# Patient Record
Sex: Male | Born: 1990 | Race: Black or African American | Hispanic: No | Marital: Single | Smoking: Current every day smoker
Health system: Southern US, Community
[De-identification: ages and names within clinical notes are randomized; demographics above are authoritative.]

## PROBLEM LIST (undated history)

## (undated) HISTORY — PX: OTHER SURGICAL HISTORY: SHX169

---

## 2011-10-24 ENCOUNTER — Emergency Department (HOSPITAL_COMMUNITY)
Admission: EM | Admit: 2011-10-24 | Discharge: 2011-10-25 | Discharge status: Against Medical Advice | Payer: Self-pay | Attending: Emergency Medicine | Admitting: Emergency Medicine

## 2011-10-24 ENCOUNTER — Emergency Department (HOSPITAL_COMMUNITY)
Admission: EM | Admit: 2011-10-24 | Discharge: 2011-10-24 | Disposition: A | Discharge status: Home / Self Care | Payer: Self-pay | Source: Home / Self Care | Attending: Emergency Medicine | Admitting: Emergency Medicine

## 2011-10-24 ENCOUNTER — Encounter (HOSPITAL_COMMUNITY): Payer: Self-pay | Admitting: Emergency Medicine

## 2011-10-24 DIAGNOSIS — M5430 Sciatica, unspecified side: Secondary | ICD-10-CM | POA: Insufficient documentation

## 2011-10-24 DIAGNOSIS — M5432 Sciatica, left side: Secondary | ICD-10-CM

## 2011-10-24 DIAGNOSIS — F17200 Nicotine dependence, unspecified, uncomplicated: Secondary | ICD-10-CM | POA: Insufficient documentation

## 2011-10-24 MED ORDER — NAPROXEN 500 MG PO TABS: 500.0000 mg | ORAL_TABLET | Freq: Two times a day (BID) | ORAL | Status: DC

## 2011-10-24 MED ORDER — PREDNISONE 20 MG PO TABS: 40.0000 mg | ORAL_TABLET | Freq: Every day | ORAL | Status: DC

## 2011-10-24 NOTE — ED Notes (Signed)
Pt states that he has a pain that goes down the back side of his legs. Pt states painful with movement. Pt ambulatory.

## 2011-10-24 NOTE — ED Provider Notes (Signed)
History     CSN: [id number]  Arrival date & time 10/24/11  8469   First MD Initiated Contact with Patient 10/24/11 [phone]      Chief Complaint  Patient presents with  . Leg Pain    (Consider location/radiation/quality/duration/timing/severity/associated sxs/prior treatment) HPI Comments: The patient complains of approximately 3 weeks of intermittent left leg pain. He states that the pain starts in his lower back and left buttock and then spreads into his lower extremity at the thigh to the knee. This is a sharp and shooting pain, worse when he bends over, intermittent and better when he lays down at night. He denies any pathologic red flags for back pain. He has not taken any medication prior to arrival.  Patient is a 21 y.o. male presenting with leg pain. The history is provided by the patient.  Leg Pain  Pertinent negatives include no numbness.    History reviewed. No pertinent past medical history.  History reviewed. No pertinent past surgical history.  History reviewed. No pertinent family history.  History  Substance Use Topics  . Smoking status: Current Everyday Smoker -- 0.5 packs/day  . Smokeless tobacco: Not on file  . Alcohol Use: No      Review of Systems  Constitutional: Negative for fever and chills.  HENT: Negative for neck pain.   Cardiovascular: Negative for leg swelling.  Gastrointestinal: Negative for nausea and vomiting.       No incontinence of bowel  Genitourinary: Negative for difficulty urinating.       No incontinence or retention  Musculoskeletal: Positive for back pain.  Skin: Negative for rash.  Neurological: Negative for weakness and numbness.    Allergies  Review of patient's allergies indicates no known allergies.  Home Medications   Current Outpatient Rx  Name Route Sig Dispense Refill  . NAPROXEN 500 MG PO TABS Oral Take 1 tablet (500 mg total) by mouth 2 (two) times daily with a meal. 30 tablet 0  . PREDNISONE 20 MG PO TABS Oral  Take 2 tablets (40 mg total) by mouth daily. 10 tablet 0    BP 142/69  Pulse 75  Temp 98.1 F (36.7 C) (Oral)  Resp 18  SpO2 98%  Physical Exam  Nursing note and vitals reviewed. Constitutional: He appears well-developed and well-nourished. No distress.  HENT:  Head: Normocephalic and atraumatic.  Eyes: Conjunctivae are normal. Right eye exhibits no discharge. Left eye exhibits no discharge. No scleral icterus.  Neck: Normal range of motion. Neck supple.  Cardiovascular: Normal rate and regular rhythm.   Pulmonary/Chest: Effort normal and breath sounds normal.  Abdominal: Soft. There is no tenderness.       No abdominal masses palpated, no tenderness  Musculoskeletal: Normal range of motion. He exhibits tenderness ( Tender to palpation in the left lower back and left buttock. Pain reproducible with straight leg raise to 45. Relieved with flexion of the knee and hip). He exhibits no edema.  Neurological:       Normal strength and sensation of the bilateral lower extremities, normal reflexes at the patellar tendons, mildly antalgic gait secondary to back pain  Skin: Skin is warm and dry. No rash noted. He is not diaphoretic. No erythema.    ED Course  Procedures (including critical care time)  Labs Reviewed - No data to display No results found.   1. Sciatica of left side       MDM  Overall well-appearing, intermittent but worsening sciatica, patient declines Decadron but accepts  oral prednisone and Naprosyn as outpatient.  Discharge Prescriptions include:  Naprosyn Prednisone  Followup instructions given, indications for return outlined, patient expresses understanding.        Vida Roller, MD 10/24/11 [phone]

## 2011-10-24 NOTE — ED Notes (Signed)
Patient complaining of left leg pain that started three weeks ago.  States that pain starts in left hip area and spreads down leg.  Denies injury.

## 2011-10-24 NOTE — Discharge Instructions (Signed)
Your back pain should be treated with medicines such as ibuprofen or aleve and this back pain should get better over the next 2 weeks.  However if you develop severe or worsening pain, low back pain with fever, numbness, weakness or inability to walk or urinate, you should return to the ER immediately.  Please follow up with your doctor this week for a recheck if still having symptoms.  

## 2011-10-24 NOTE — ED Notes (Signed)
PT is reporting he has left leg pain from lower back to buttocks to calf that is causing him difficulty walking; sciatic type symptoms and cramping sensation especially right when he gets up from rest.

## 2011-10-25 NOTE — ED Notes (Signed)
Pt ambulated to restroom with no distress.  

## 2011-10-25 NOTE — ED Notes (Signed)
Pt reports he wants to sign out; he "has px meds at home but wanted a shot since it was quicker". PA aware.

## 2011-12-11 ENCOUNTER — Encounter (HOSPITAL_COMMUNITY): Payer: Self-pay | Admitting: Emergency Medicine

## 2011-12-11 ENCOUNTER — Emergency Department (HOSPITAL_COMMUNITY)
Admission: EM | Admit: 2011-12-11 | Discharge: 2011-12-11 | Disposition: A | Discharge status: Home / Self Care | Payer: Self-pay | Attending: Emergency Medicine | Admitting: Emergency Medicine

## 2011-12-11 DIAGNOSIS — M545 Low back pain, unspecified: Secondary | ICD-10-CM | POA: Insufficient documentation

## 2011-12-11 DIAGNOSIS — X58XXXA Exposure to other specified factors, initial encounter: Secondary | ICD-10-CM | POA: Insufficient documentation

## 2011-12-11 DIAGNOSIS — T148XXA Other injury of unspecified body region, initial encounter: Secondary | ICD-10-CM | POA: Insufficient documentation

## 2011-12-11 DIAGNOSIS — M722 Plantar fascial fibromatosis: Secondary | ICD-10-CM | POA: Insufficient documentation

## 2011-12-11 DIAGNOSIS — F17200 Nicotine dependence, unspecified, uncomplicated: Secondary | ICD-10-CM | POA: Insufficient documentation

## 2011-12-11 DIAGNOSIS — M25569 Pain in unspecified knee: Secondary | ICD-10-CM | POA: Insufficient documentation

## 2011-12-11 DIAGNOSIS — M549 Dorsalgia, unspecified: Secondary | ICD-10-CM

## 2011-12-11 DIAGNOSIS — M25561 Pain in right knee: Secondary | ICD-10-CM

## 2011-12-11 MED ORDER — NAPROXEN 500 MG PO TABS: 500.0000 mg | ORAL_TABLET | Freq: Two times a day (BID) | ORAL | Status: DC

## 2011-12-11 NOTE — ED Provider Notes (Signed)
History     CSN: [id number]  Arrival date & time 12/11/11  2030   First MD Initiated Contact with Patient 12/11/11 2129      Chief Complaint  Patient presents with  . Knee Pain  . Back Pain   HPI  History provided by the patient. Patient is a 21 year old male with no significant PMH who presents with multiple complaints. Patient complains of right knee pain, left lower back pain that radiates to the buttocks and left heel pains. Symptoms have been waxing and waning for the past month or longer. Patient denies any specific injuries or activities that cause pains. Pains are worse with movements and walking. Patient reports left heel pain is especially intense when he first wakes up in the morning and does improve with activity through the day. Right knee pains also worse with walking and movements. Patient has used occasional over-the-counter medications but nothing regularly. He has not used any other treatments. Denies any other symptoms. Denies any numbness or weakness. Denies any fecal or urinary incontinence, urinary retention or perineal numbness.    History reviewed. No pertinent past medical history.  History reviewed. No pertinent past surgical history.  History reviewed. No pertinent family history.  History  Substance Use Topics  . Smoking status: Current Everyday Smoker -- 0.5 packs/day  . Smokeless tobacco: Not on file  . Alcohol Use: No      Review of Systems  Gastrointestinal: Negative for nausea and vomiting.  Genitourinary: Negative for dysuria, hematuria and flank pain.  Musculoskeletal: Positive for back pain and arthralgias.  Neurological: Negative for weakness and numbness.    Allergies  Review of patient's allergies indicates no known allergies.  Home Medications  No current outpatient prescriptions on file.  BP 132/55  Pulse 64  Temp 99.1 F (37.3 C) (Oral)  Resp 18  SpO2 100%  Physical Exam  Nursing note and vitals reviewed. Constitutional:  He is oriented to person, place, and time. He appears well-developed and well-nourished.  HENT:  Head: Normocephalic.  Cardiovascular: Normal rate and regular rhythm.   Pulmonary/Chest: Effort normal and breath sounds normal.  Abdominal: Soft.       No CVA tenderness  Musculoskeletal:       Lumbar back: He exhibits no tenderness and no bony tenderness.       Mild tenderness of left heel and plantar surface. No gross deformities. Normal dorsal pedal pulses, sensation in toes and cap refill.  Full range of motion of right knee. Negative anterior posterior drawer test. No increased laxity with valgus rare stress. Very mild signs of joint effusion on ballottement. Normal distal sensations, pulses in foot.     Neurological: He is alert and oriented to person, place, and time. He has normal strength. No sensory deficit.  Skin: Skin is warm. No rash noted. No erythema.  Psychiatric: He has a normal mood and affect. His behavior is normal.    ED Course  Procedures     1. Back pain   2. Muscle strain   3. Plantar fasciitis of left foot   4. Knee pain, right       MDM  9:50PM patient seen and evaluated. Patient with multiple complaints. No concerning or emergent findings on exam. Patient with no lumbar spinous tenderness. No concerning symptoms. No indications for imaging at this time.        Angus Seller, [place] 12/12/11 [phone]

## 2011-12-11 NOTE — ED Notes (Addendum)
Patient complaining of lower back pain that radiates down his left leg, especially upon movement, that started a month ago.  Patient also reporting of right knee pain and swelling; reports being diagnosed with fluid around knee.  Patient ambulatory to in triage; able to move all extremities without difficulty.  Denies any changes to urine; denies any injuries.

## 2011-12-11 NOTE — ED Notes (Signed)
Patient is alert and oriented x4.  Patient is able to walk.  Discharge instructions were explained no questions asked.

## 2011-12-14 NOTE — ED Provider Notes (Signed)
Medical screening examination/treatment/procedure(s) were performed by non-physician practitioner and as supervising physician I was immediately available for consultation/collaboration.   Lonnetta Kniskern, MD 12/14/11 2232 

## 2011-12-22 ENCOUNTER — Emergency Department (HOSPITAL_COMMUNITY)
Admission: EM | Admit: 2011-12-22 | Discharge: 2011-12-22 | Disposition: A | Discharge status: Home / Self Care | Payer: Self-pay | Attending: Emergency Medicine | Admitting: Emergency Medicine

## 2011-12-22 ENCOUNTER — Encounter (HOSPITAL_COMMUNITY): Payer: Self-pay | Admitting: Emergency Medicine

## 2011-12-22 DIAGNOSIS — M5430 Sciatica, unspecified side: Secondary | ICD-10-CM | POA: Insufficient documentation

## 2011-12-22 DIAGNOSIS — F17200 Nicotine dependence, unspecified, uncomplicated: Secondary | ICD-10-CM | POA: Insufficient documentation

## 2011-12-22 LAB — URINALYSIS, ROUTINE W REFLEX MICROSCOPIC
Ketones, ur: NEGATIVE mg/dL
Leukocytes, UA: NEGATIVE
Nitrite: NEGATIVE
Protein, ur: NEGATIVE mg/dL
Urobilinogen, UA: 1 mg/dL (ref 0.0–1.0)
pH: 6 (ref 5.0–8.0)

## 2011-12-22 MED ORDER — PREDNISONE 20 MG PO TABS
60.0000 mg | ORAL_TABLET | Freq: Once | ORAL | Status: AC
  Administered 2011-12-22: 60 mg via ORAL
  Filled 2011-12-22: qty 3

## 2011-12-22 MED ORDER — IBUPROFEN 800 MG PO TABS: 800.0000 mg | ORAL_TABLET | Freq: Three times a day (TID) | ORAL | Status: AC

## 2011-12-22 MED ORDER — PREDNISONE 10 MG PO TABS: 20.0000 mg | ORAL_TABLET | Freq: Every day | ORAL | Status: DC

## 2011-12-22 MED ORDER — IBUPROFEN 800 MG PO TABS
800.0000 mg | ORAL_TABLET | Freq: Once | ORAL | Status: AC
  Administered 2011-12-22: 800 mg via ORAL
  Filled 2011-12-22: qty 1

## 2011-12-22 NOTE — ED Notes (Signed)
PT. REPORTS HEADACHE AND LOW ABDOMINAL PAIN ONSET THIS EVENING , LOW BACK PAIN RADIATING TO LEFT LOWER LEG FOR 1 MONTH AND LEFT WRIST PAIN FOR SEVERAL DAYS .

## 2011-12-22 NOTE — ED Provider Notes (Signed)
History     CSN: [id number]  Arrival date & time 12/22/11  0039   First MD Initiated Contact with Patient 12/22/11 [phone]      Chief Complaint  Patient presents with  . Headache    (Consider location/radiation/quality/duration/timing/severity/associated sxs/prior treatment) HPI Comments: Patient presents with left low back pain radiating down his left leg for the past one month. His been seen multiple times for this in the past. He states he did not fill his prescriptions. The pain radiates down his left leg. There is no weakness, numbness, tingling, bowel or bladder incontinence, fever or vomiting. He denies any injury to his back. Also reports gradual onset headache tonight with intermittent left wrist pain with movement. No trauma to the wrist.  The history is provided by the patient.    History reviewed. No pertinent past medical history.  History reviewed. No pertinent past surgical history.  No family history on file.  History  Substance Use Topics  . Smoking status: Current Everyday Smoker -- 0.5 packs/day  . Smokeless tobacco: Not on file  . Alcohol Use: No      Review of Systems  Constitutional: Negative for fever, activity change and appetite change.  HENT: Negative for rhinorrhea.   Respiratory: Negative for chest tightness.   Cardiovascular: Negative for chest pain.  Gastrointestinal: Positive for abdominal pain. Negative for nausea and vomiting.  Genitourinary: Negative for dysuria.  Musculoskeletal: Positive for back pain.  Skin: Negative for wound.  Neurological: Positive for headaches. Negative for weakness and numbness.    Allergies  Review of patient's allergies indicates no known allergies.  Home Medications   Current Outpatient Rx  Name Route Sig Dispense Refill  . IBUPROFEN 800 MG PO TABS Oral Take 1 tablet (800 mg total) by mouth 3 (three) times daily. 21 tablet 0  . NAPROXEN 500 MG PO TABS Oral Take 1 tablet (500 mg total) by mouth 2 (two)  times daily. 30 tablet 0  . PREDNISONE 10 MG PO TABS Oral Take 2 tablets (20 mg total) by mouth daily. 15 tablet 0    BP 141/83  Temp 98.2 F (36.8 C) (Oral)  Resp 18  SpO2 98%  Physical Exam  Constitutional: He is oriented to person, place, and time. He appears well-developed and well-nourished. No distress.       Talking on phone  HENT:  Head: Normocephalic and atraumatic.  Mouth/Throat: Oropharynx is clear and moist. No oropharyngeal exudate.  Eyes: Conjunctivae and EOM are normal. Pupils are equal, round, and reactive to light.  Neck: Normal range of motion. Neck supple.  Cardiovascular: Normal rate, regular rhythm and normal heart sounds.   Pulmonary/Chest: Effort normal and breath sounds normal. No respiratory distress.  Abdominal: Soft. Bowel sounds are normal. He exhibits no mass. There is no tenderness. There is no rebound and no guarding.  Musculoskeletal: Normal range of motion. He exhibits tenderness.       L paraspinal lumbar tenderness and SI joint tenderness  L wrist without deformity. NO snuffbox pain. +2 radial pulse, cardinal hand movements intact.  Neurological: He is alert and oriented to person, place, and time. He has normal reflexes. No cranial nerve deficit.       5/5 strength in the lower extremities. +2 patellar reflexes. Ankle plantar and dorsi flexion intact bilaterally. +2 DP and PT pulses. Able to extend great toes bilaterally.  Skin: Skin is warm.    ED Course  Procedures (including critical care time)   Labs Reviewed  URINALYSIS,  ROUTINE W REFLEX MICROSCOPIC   No results found.   1. Sciatica       MDM  Multiple complaints including headache, abdominal pain, wrist pain, back pain.  Primary complaint seems to be L low back pain radiating down L leg. Abdomen is soft and nontender.   Appears well, no distress. No neuro deficits or red flags. Treat sciatica with NSAIDS and steroids. PCP referrals given.      Glynn Octave,  MD 12/22/11 1535

## 2011-12-22 NOTE — ED Notes (Signed)
Pt. States abdominal pain starting this morning after eating a bologna sandwich, constant nausea, denies tenderness or vomiting. Reports difficulty with starting urinary stream. Also reports headache starting today. Also reports left wrist pain for x3 days. Denies injury. Pulses strong, cap refill instant. Pt. Alert and oriented x4.

## 2011-12-22 NOTE — ED Notes (Signed)
Prescriptions given with discharge instructions.  

## 2011-12-24 ENCOUNTER — Encounter (HOSPITAL_COMMUNITY): Payer: Self-pay | Admitting: Emergency Medicine

## 2011-12-24 ENCOUNTER — Emergency Department (HOSPITAL_COMMUNITY)
Admission: EM | Admit: 2011-12-24 | Discharge: 2011-12-24 | Disposition: A | Discharge status: Home / Self Care | Payer: Self-pay | Attending: Emergency Medicine | Admitting: Emergency Medicine

## 2011-12-24 DIAGNOSIS — M545 Low back pain, unspecified: Secondary | ICD-10-CM | POA: Insufficient documentation

## 2011-12-24 DIAGNOSIS — M549 Dorsalgia, unspecified: Secondary | ICD-10-CM

## 2011-12-24 DIAGNOSIS — F17200 Nicotine dependence, unspecified, uncomplicated: Secondary | ICD-10-CM | POA: Insufficient documentation

## 2011-12-24 MED ORDER — KETOROLAC TROMETHAMINE 30 MG/ML IJ SOLN
INTRAMUSCULAR | Status: AC
  Administered 2011-12-24: 60 mg via INTRAMUSCULAR
  Filled 2011-12-24: qty 2

## 2011-12-24 MED ORDER — KETOROLAC TROMETHAMINE 15 MG/ML IJ SOLN
60.0000 mg | Freq: Once | INTRAMUSCULAR | Status: DC
  Filled 2011-12-24: qty 4

## 2011-12-24 MED ORDER — TRAMADOL HCL 50 MG PO TABS: 50.0000 mg | ORAL_TABLET | Freq: Four times a day (QID) | ORAL | Status: DC | PRN

## 2011-12-24 NOTE — ED Notes (Signed)
C/o L lower back pain that radiates to L hip and L leg since Friday morning.  Denies known injury.

## 2011-12-24 NOTE — ED Provider Notes (Signed)
History     CSN: [id number]  Arrival date & time 12/24/11  0200   First MD Initiated Contact with Patient 12/24/11 0301      Chief Complaint  Patient presents with  . Back Pain    (Consider location/radiation/quality/duration/timing/severity/associated sxs/prior treatment) Patient is a 21 y.o. male presenting with back pain. The history is provided by the patient.  Back Pain  This is a chronic problem. The current episode started more than 1 week ago. The problem occurs constantly. The problem has not changed since onset.The pain is associated with no known injury. The pain is present in the lumbar spine. The quality of the pain is described as aching. The pain radiates to the left thigh, left knee and left foot. The pain is at a severity of 3/10. The pain is mild. He has tried NSAIDs for the symptoms. The treatment provided no relief.    History reviewed. No pertinent past medical history.  History reviewed. No pertinent past surgical history.  No family history on file.  History  Substance Use Topics  . Smoking status: Current Everyday Smoker -- 0.5 packs/day  . Smokeless tobacco: Not on file  . Alcohol Use: No      Review of Systems  Musculoskeletal: Positive for back pain.  All other systems reviewed and are negative.    Allergies  Review of patient's allergies indicates no known allergies.  Home Medications   Current Outpatient Rx  Name Route Sig Dispense Refill  . IBUPROFEN 800 MG PO TABS Oral Take 1 tablet (800 mg total) by mouth 3 (three) times daily. 21 tablet 0  . NAPROXEN 500 MG PO TABS Oral Take 1 tablet (500 mg total) by mouth 2 (two) times daily. 30 tablet 0  . PREDNISONE 10 MG PO TABS Oral Take 2 tablets (20 mg total) by mouth daily. 15 tablet 0    BP 125/58  Temp 98.2 F (36.8 C) (Oral)  Resp 18  SpO2 98%  Physical Exam  Constitutional: He is oriented to person, place, and time. He appears well-developed and well-nourished.  HENT:  Head:  Normocephalic and atraumatic.  Eyes: Conjunctivae are normal. Pupils are equal, round, and reactive to light.  Neck: Normal range of motion. Neck supple.  Cardiovascular: Normal rate, regular rhythm, normal heart sounds and intact distal pulses.   Pulmonary/Chest: Effort normal and breath sounds normal.  Abdominal: Soft. Bowel sounds are normal.  Neurological: He is alert and oriented to person, place, and time.  Skin: Skin is warm and dry.  Psychiatric: He has a normal mood and affect. His behavior is normal. Judgment and thought content normal.    ED Course  Procedures (including critical care time)  Labs Reviewed - No data to display No results found.   No diagnosis found.    MDM  No sxs of epidural compression.  nv intact.  Cap refill normal.  Normal web space sensation.  Will analgesia, outpt fu with pmd,  Ret new/worsening sxs        Trevino Wyatt Lytle Michaels, MD 12/24/11 [phone]

## 2012-01-03 ENCOUNTER — Encounter (HOSPITAL_COMMUNITY): Payer: Self-pay | Admitting: Emergency Medicine

## 2012-01-03 ENCOUNTER — Emergency Department (HOSPITAL_COMMUNITY)
Admission: EM | Admit: 2012-01-03 | Discharge: 2012-01-03 | Disposition: A | Discharge status: Home / Self Care | Payer: Self-pay | Attending: Emergency Medicine | Admitting: Emergency Medicine

## 2012-01-03 ENCOUNTER — Emergency Department (HOSPITAL_COMMUNITY): Payer: Self-pay

## 2012-01-03 DIAGNOSIS — M549 Dorsalgia, unspecified: Secondary | ICD-10-CM | POA: Insufficient documentation

## 2012-01-03 DIAGNOSIS — F17200 Nicotine dependence, unspecified, uncomplicated: Secondary | ICD-10-CM | POA: Insufficient documentation

## 2012-01-03 DIAGNOSIS — M79609 Pain in unspecified limb: Secondary | ICD-10-CM | POA: Insufficient documentation

## 2012-01-03 MED ORDER — METHOCARBAMOL 500 MG PO TABS: 500.0000 mg | ORAL_TABLET | Freq: Two times a day (BID) | ORAL | Status: AC

## 2012-01-03 MED ORDER — HYDROCODONE-ACETAMINOPHEN 5-325 MG PO TABS
1.0000 | ORAL_TABLET | Freq: Once | ORAL | Status: AC
  Administered 2012-01-03: 1 via ORAL
  Filled 2012-01-03: qty 1

## 2012-01-03 NOTE — ED Notes (Signed)
PT. REPORTS CHRONIC LOW BACK PAIN RADIATING TO LEFT LEG FOR SEVERAL MONTHS , DENIES INJURY , AMBULATORY .

## 2012-01-03 NOTE — ED Provider Notes (Signed)
History     CSN: [id number]  Arrival date & time 01/03/12  2006   First MD Initiated Contact with Patient 01/03/12 2212      Chief Complaint  Patient presents with  . Back Pain   HPI  History provided by the patient in prior medical charts. Patient is a 21 year old male with no significant PMH who returns for continued low back pains. Patient has multiple visits to the emergency room for same complaints. Patient states he has had low back pain for the past 3 months. Pain is worse with increased activity and movements. Pain is worse at the end of the day after working. Patient has been using over-the-counter pain medications and occasionally prescription narcotics for which he is obtained from emergency room visits.    History reviewed. No pertinent past medical history.  History reviewed. No pertinent past surgical history.  No family history on file.  History  Substance Use Topics  . Smoking status: Current Everyday Smoker -- 0.5 packs/day  . Smokeless tobacco: Not on file  . Alcohol Use: No      Review of Systems  Constitutional: Negative for fever, chills and appetite change.  Respiratory: Negative for cough and shortness of breath.   Cardiovascular: Negative for chest pain.  Gastrointestinal: Negative for nausea, vomiting and abdominal pain.  Genitourinary: Negative for dysuria, frequency, hematuria and flank pain.  Musculoskeletal: Positive for back pain.  Neurological: Negative for weakness, light-headedness and numbness.    Allergies  Review of patient's allergies indicates no known allergies.  Home Medications  No current outpatient prescriptions on file.  BP 125/73  Pulse 67  Temp 98.3 F (36.8 C) (Oral)  Resp 16  SpO2 98%  Physical Exam  Nursing note and vitals reviewed. Constitutional: He is oriented to person, place, and time. He appears well-developed and well-nourished.  HENT:  Head: Normocephalic.  Cardiovascular: Normal rate and regular  rhythm.   Pulmonary/Chest: Effort normal and breath sounds normal.  Abdominal: Soft. There is no tenderness. There is no rebound and no guarding.       No CVA tenderness  Musculoskeletal:       Lumbar back: He exhibits tenderness. He exhibits no bony tenderness.       Back:            Neurological: He is alert and oriented to person, place, and time. He has normal strength. No sensory deficit. Gait normal.  Skin: Skin is warm. No rash noted. No erythema.  Psychiatric: He has a normal mood and affect. His behavior is normal.    ED Course  Procedures   Dg Lumbar Spine Complete  01/03/2012  *RADIOLOGY REPORT*  Clinical Data: Low back and left hip and leg pain.  LUMBAR SPINE - COMPLETE 4+ VIEW  Comparison: None.  Findings: Vertebral body height is maintained.  Intervertebral disc space height appears normal.  Convex left scoliosis noted. Paraspinous structures are unremarkable.  IMPRESSION: Convex left scoliosis.  Otherwise negative.   Original Report Authenticated By: Bernadene Bell. D'ALESSIO, M.D.      1. Back pain       MDM  Patient seen and evaluated. Patient with multiple prior visits to emergency room for similar complaints. No new injury or trauma. No new symptoms. Patient given instructions for the need to followup with a primary care provider or orthopedic specialist.        Angus Seller, PA 01/04/12 [phone]

## 2012-01-04 NOTE — ED Provider Notes (Signed)
Medical screening examination/treatment/procedure(s) were performed by non-physician practitioner and as supervising physician I was immediately available for consultation/collaboration.   Carleene Cooper III, MD 01/04/12 1124

## 2012-01-11 ENCOUNTER — Encounter (HOSPITAL_COMMUNITY): Payer: Self-pay | Admitting: Emergency Medicine

## 2012-01-11 ENCOUNTER — Emergency Department (HOSPITAL_COMMUNITY): Payer: Self-pay

## 2012-01-11 DIAGNOSIS — M79609 Pain in unspecified limb: Secondary | ICD-10-CM | POA: Insufficient documentation
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
## 2012-01-12 ENCOUNTER — Emergency Department (HOSPITAL_COMMUNITY)
Admission: EM | Admit: 2012-01-12 | Discharge: 2012-01-12 | Discharge status: Against Medical Advice | Payer: Self-pay | Attending: Emergency Medicine | Admitting: Emergency Medicine

## 2012-01-12 MED ORDER — IBUPROFEN 800 MG PO TABS: 800.0000 mg | ORAL_TABLET | Freq: Once | ORAL | Status: DC

## 2012-02-21 ENCOUNTER — Emergency Department (HOSPITAL_COMMUNITY)
Admission: EM | Admit: 2012-02-21 | Discharge: 2012-02-21 | Disposition: A | Discharge status: Home / Self Care | Payer: Self-pay | Attending: Emergency Medicine | Admitting: Emergency Medicine

## 2012-02-21 ENCOUNTER — Encounter (HOSPITAL_COMMUNITY): Payer: Self-pay | Admitting: *Deleted

## 2012-02-21 DIAGNOSIS — F17200 Nicotine dependence, unspecified, uncomplicated: Secondary | ICD-10-CM | POA: Insufficient documentation

## 2012-02-21 DIAGNOSIS — K644 Residual hemorrhoidal skin tags: Secondary | ICD-10-CM | POA: Insufficient documentation

## 2012-02-21 MED ORDER — PRAMOXINE HCL 1 % RE FOAM: RECTAL | Status: DC | PRN

## 2012-02-21 NOTE — ED Notes (Signed)
Pt states that when he takes a shower he has a burning pain in his rectum. Pt states fiance told him it was a hemroid. Pt denies bleeding when wiping.

## 2012-02-21 NOTE — ED Provider Notes (Signed)
History     CSN: [id number]  Arrival date & time 02/21/12  0024   First MD Initiated Contact with Patient 02/21/12 0100      Chief Complaint  Patient presents with  . Back Pain    buttock/ peri anal  area    (Consider location/radiation/quality/duration/timing/severity/associated sxs/prior treatment) HPI Comments: 21 year old male presents emergency department complaining of a burning pain in his rectum while he took a shower tonight. His fiance told him that it looked like a hemorrhoid. He had no problems prior to taking a shower this evening besides mild discomfort while defecating earlier today. No blood in his stool or on the toilet paper. Denies constipation or straining. No back pain.  Patient is a 21 y.o. male presenting with back pain. The history is provided by the patient.  Back Pain  Pertinent negatives include no chest pain and no fever.    History reviewed. No pertinent past medical history.  History reviewed. No pertinent past surgical history.  History reviewed. No pertinent family history.  History  Substance Use Topics  . Smoking status: Current Every Day Smoker -- 0.5 packs/day  . Smokeless tobacco: Not on file  . Alcohol Use: No      Review of Systems  Constitutional: Negative for fever and chills.  Respiratory: Negative for shortness of breath.   Cardiovascular: Negative for chest pain.  Gastrointestinal: Positive for rectal pain. Negative for nausea, constipation, blood in stool and anal bleeding.  Musculoskeletal: Negative for back pain.  Skin: Negative for rash.    Allergies  Review of patient's allergies indicates no known allergies.  Home Medications  No current outpatient prescriptions on file.  BP 128/69  Pulse 67  Temp 98.2 F (36.8 C) (Oral)  Resp 16  SpO2 100%  Physical Exam  Constitutional: He is oriented to person, place, and time. He appears well-developed and well-nourished. No distress.  HENT:  Head: Normocephalic and  atraumatic.  Mouth/Throat: Oropharynx is clear and moist.  Eyes: Conjunctivae normal are normal.  Neck: Normal range of motion.  Cardiovascular: Normal rate, regular rhythm and normal heart sounds.   Pulmonary/Chest: Effort normal and breath sounds normal.  Abdominal: Soft. Bowel sounds are normal. There is no tenderness.  Genitourinary: Rectal exam shows external hemorrhoid (no bleeding or thrombosis).  Musculoskeletal: Normal range of motion.  Neurological: He is alert and oriented to person, place, and time.  Skin: Skin is warm and dry. No rash noted.  Psychiatric: He has a normal mood and affect. His behavior is normal.    ED Course  Procedures (including critical care time)  Labs Reviewed - No data to display No results found.   1. External hemorrhoid       MDM  21 year old male with external hemorrhoid. No active bleeding or thrombosis. Very mild tender to palpation. Will discharge with ProctoCream. Advised him to watch for blood in the stool.        Trevor Mace, PA-C 02/21/12 0120

## 2012-02-21 NOTE — ED Provider Notes (Signed)
Medical screening examination/treatment/procedure(s) were performed by non-physician practitioner and as supervising physician I was immediately available for consultation/collaboration.    Vida Roller, MD 02/21/12 [phone]

## 2012-06-07 ENCOUNTER — Encounter (HOSPITAL_COMMUNITY): Payer: Self-pay | Admitting: Emergency Medicine

## 2012-06-07 ENCOUNTER — Emergency Department (HOSPITAL_COMMUNITY): Payer: Self-pay

## 2012-06-07 ENCOUNTER — Emergency Department (HOSPITAL_COMMUNITY)
Admission: EM | Admit: 2012-06-07 | Discharge: 2012-06-07 | Disposition: A | Discharge status: Home / Self Care | Payer: Self-pay | Attending: Emergency Medicine | Admitting: Emergency Medicine

## 2012-06-07 DIAGNOSIS — F17200 Nicotine dependence, unspecified, uncomplicated: Secondary | ICD-10-CM | POA: Insufficient documentation

## 2012-06-07 DIAGNOSIS — R112 Nausea with vomiting, unspecified: Secondary | ICD-10-CM | POA: Insufficient documentation

## 2012-06-07 MED ORDER — ONDANSETRON 4 MG PO TBDP
ORAL_TABLET | ORAL | Status: AC
  Administered 2012-06-07: 4 mg via ORAL
  Filled 2012-06-07: qty 1

## 2012-06-07 MED ORDER — ACETAMINOPHEN 325 MG PO TABS
650.0000 mg | ORAL_TABLET | Freq: Once | ORAL | Status: AC
  Administered 2012-06-07: 650 mg via ORAL
  Filled 2012-06-07: qty 2

## 2012-06-07 MED ORDER — ONDANSETRON HCL 4 MG PO TABS: 4.0000 mg | ORAL_TABLET | Freq: Three times a day (TID) | ORAL | Status: DC | PRN

## 2012-06-07 MED ORDER — ONDANSETRON HCL 4 MG/2ML IJ SOLN: 4.0000 mg | Freq: Once | INTRAMUSCULAR | Status: DC

## 2012-06-07 MED ORDER — ONDANSETRON 4 MG PO TBDP
4.0000 mg | ORAL_TABLET | Freq: Once | ORAL | Status: AC
  Administered 2012-06-07: 4 mg via ORAL

## 2012-06-07 NOTE — ED Provider Notes (Signed)
Medical screening examination/treatment/procedure(s) were performed by non-physician practitioner and as supervising physician I was immediately available for consultation/collaboration.   Chundra Sauerwein, MD 06/07/12 1552 

## 2012-06-07 NOTE — ED Provider Notes (Signed)
History     CSN: [id number]  Arrival date & time 06/07/12  0913   First MD Initiated Contact with Patient 06/07/12 [phone]      Chief Complaint  Patient presents with  . Abdominal Pain    (Consider location/radiation/quality/duration/timing/severity/associated sxs/prior treatment) HPI Comments: This is a 22 year old male, who presents emergency department with chief complaint of nausea and vomiting. Patient also endorses associated left upper abdominal pain. He states that he is scheduled to be in court at 10:30 today in [place]. He states his pain is mild. He additionally complains of some chest pain, but is worsened with coughing. He states that he has had a productive cough for the past week or so. He states that his child has been sick with the same. He has not tried anything to alleviate her symptoms. Nothing makes his symptoms better or worse.  The history is provided by the patient. No language interpreter was used.    History reviewed. No pertinent past medical history.  History reviewed. No pertinent past surgical history.  No family history on file.  History  Substance Use Topics  . Smoking status: Current Every Day Smoker -- 0.5 packs/day    Types: Cigarettes  . Smokeless tobacco: Not on file  . Alcohol Use: No      Review of Systems  All other systems reviewed and are negative.    Allergies  Morphine and related  Home Medications   Current Outpatient Rx  Name  Route  Sig  Dispense  Refill  . ACETAMINOPHEN 500 MG PO TABS   Oral   Take 1,000 mg by mouth every 6 (six) hours as needed. For pain           BP 128/70  Pulse 63  Temp 97.8 F (36.6 C) (Oral)  Resp 20  Ht 6' (1.829 m)  Wt 180 lb (81.647 kg)  BMI 24.41 kg/m2  SpO2 100%  Physical Exam  Nursing note and vitals reviewed. Constitutional: He is oriented to person, place, and time. He appears well-developed and well-nourished.  HENT:  Head: Normocephalic and atraumatic.  Right Ear:  External ear normal.  Left Ear: External ear normal.  Nose: Nose normal.  Mouth/Throat: Oropharynx is clear and moist. No oropharyngeal exudate.  Eyes: Conjunctivae normal and EOM are normal. Pupils are equal, round, and reactive to light. Right eye exhibits no discharge. Left eye exhibits no discharge. No scleral icterus.  Neck: Normal range of motion. Neck supple. No JVD present.  Cardiovascular: Normal rate, regular rhythm, normal heart sounds and intact distal pulses.  Exam reveals no gallop and no friction rub.   No murmur heard. Pulmonary/Chest: Effort normal and breath sounds normal. No respiratory distress. He has no wheezes. He has no rales. He exhibits no tenderness.  Abdominal: Soft. Bowel sounds are normal. He exhibits no distension and no mass. There is no tenderness. There is no rebound and no guarding.       Mildly tender abdomen in the upper left quadrant, no right upper quadrant tenderness or Murphy sign, no right lower quadrant tenderness or pain at McBurney's point, no rebound tenderness, no signs of peritonitis.  Musculoskeletal: Normal range of motion. He exhibits no edema and no tenderness.  Neurological: He is alert and oriented to person, place, and time. He has normal reflexes.  Skin: Skin is warm and dry.  Psychiatric: He has a normal mood and affect. His behavior is normal. Judgment and thought content normal.    ED Course  Procedures (  including critical care time)  Labs Reviewed - No data to display No results found. ED ECG REPORT  I personally interpreted this EKG   Date: 06/07/2012   Rate:56  Rhythm: normal sinus rhythm  QRS Axis: normal  Intervals: normal  ST/T Wave abnormalities: normal  Conduction Disutrbances:none  Narrative Interpretation:   Old EKG Reviewed: none available    1. Nausea and vomiting       MDM  Is a 22 year old male with nausea and vomiting. Patient states that he is supposed to be in court at 10:30 today. I'm slightly  suspicious for malingering. However, I will treat the patient with Zofran, and obtain a chest x-ray to rule out pneumonia. Will PO challenge the patient and discharge if he tolerates PO.  10:45 AM Tolerating PO.  Feels better. Will discharge with Zofran.  Patient understands and agrees with the plan.        Roxy Horseman, PA-C 06/07/12 1045  Roxy Horseman, PA-C 06/07/12 1140

## 2012-06-07 NOTE — ED Notes (Addendum)
Pt request medication for HA. Pt drinking Sprite with out c/o nausea or abd pain

## 2012-06-07 NOTE — ED Notes (Signed)
Pt c/o LUA pain that started at 0630 with vomiting x1 and then pt reports right side midsternal CP started. NAD noted at this time, pt on cell phone.
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
## 2012-06-07 NOTE — ED Notes (Signed)
NAD noted at time of d/c home 

## 2012-07-29 ENCOUNTER — Encounter (HOSPITAL_COMMUNITY): Payer: Self-pay

## 2012-07-29 ENCOUNTER — Emergency Department (HOSPITAL_COMMUNITY)
Admission: EM | Admit: 2012-07-29 | Discharge: 2012-07-29 | Disposition: A | Discharge status: Home / Self Care | Payer: Self-pay | Attending: Emergency Medicine | Admitting: Emergency Medicine

## 2012-07-29 DIAGNOSIS — F17200 Nicotine dependence, unspecified, uncomplicated: Secondary | ICD-10-CM | POA: Insufficient documentation

## 2012-07-29 DIAGNOSIS — R110 Nausea: Secondary | ICD-10-CM | POA: Insufficient documentation

## 2012-07-29 DIAGNOSIS — R197 Diarrhea, unspecified: Secondary | ICD-10-CM | POA: Insufficient documentation

## 2012-07-29 DIAGNOSIS — R51 Headache: Secondary | ICD-10-CM | POA: Insufficient documentation

## 2012-07-29 DIAGNOSIS — R109 Unspecified abdominal pain: Secondary | ICD-10-CM | POA: Insufficient documentation

## 2012-07-29 LAB — POCT I-STAT, CHEM 8
Glucose, Bld: 85 mg/dL (ref 70–99)
HCT: 46 % (ref 39.0–52.0)
Hemoglobin: 15.6 g/dL (ref 13.0–17.0)
Potassium: 3.8 mEq/L (ref 3.5–5.1)
Sodium: 140 mEq/L (ref 135–145)
TCO2: 28 mmol/L (ref 0–100)

## 2012-07-29 LAB — CBC WITH DIFFERENTIAL/PLATELET
Basophils Relative: 0 % (ref 0–1)
Eosinophils Absolute: 0.1 10*3/uL (ref 0.0–0.7)
Eosinophils Relative: 1 % (ref 0–5)
MCH: 29.9 pg (ref 26.0–34.0)
MCHC: 34.5 g/dL (ref 30.0–36.0)
MCV: 86.7 fL (ref 78.0–100.0)
Neutrophils Relative %: 81 % — ABNORMAL HIGH (ref 43–77)
Platelets: 150 10*3/uL (ref 150–400)

## 2012-07-29 LAB — URINALYSIS, ROUTINE W REFLEX MICROSCOPIC
Bilirubin Urine: NEGATIVE
Ketones, ur: NEGATIVE mg/dL
Nitrite: NEGATIVE
Protein, ur: NEGATIVE mg/dL
Specific Gravity, Urine: 1.028 (ref 1.005–1.030)
Urobilinogen, UA: 1 mg/dL (ref 0.0–1.0)

## 2012-07-29 MED ORDER — DICYCLOMINE HCL 10 MG/ML IM SOLN
20.0000 mg | Freq: Once | INTRAMUSCULAR | Status: AC
  Administered 2012-07-29: 20 mg via INTRAMUSCULAR
  Filled 2012-07-29: qty 2

## 2012-07-29 NOTE — ED Notes (Signed)
Pt c/o HA and abd pain X 3 days along with n/v/d. Pt in nad, resp e/u, skin warm and dry. Pt sts he hasn't had much of an appetite the past couple days.

## 2012-07-29 NOTE — ED Notes (Signed)
Pt presents with c/o headache and abdominal pain.  Pt states he began having abdominal pain around the umbilicus on Friday.  Pt also c/o slight headache.  Pt has not taken any meds for relief.  Pt is alert and oriented and in NAD.

## 2012-07-29 NOTE — ED Provider Notes (Signed)
History     CSN: [id number]  Arrival date & time 07/29/12  1223   First MD Initiated Contact with Patient 07/29/12 1414      Chief Complaint  Patient presents with  . Abdominal Pain  . Headache    (Consider location/radiation/quality/duration/timing/severity/associated sxs/prior treatment) HPI Complains of lower abdominal pain onset 2 days ago intermittent sharp last approximately an hour at a time presently discomfort is mild he reports feeling hungry he's had 10 episodes of diarrhea today. No treatment prior to coming here also admits to mild headache denies fever denies cough no other associated symptoms. Nothing makes symptoms better or worse. History reviewed. No pertinent past medical history. Past medical history is negative History reviewed. No pertinent past surgical history. Surgical history negative No family history on file.  History  Substance Use Topics  . Smoking status: Current Every Day Smoker -- 0.50 packs/day    Types: Cigarettes  . Smokeless tobacco: Not on file  . Alcohol Use: No   no alcohol no drugs    Review of Systems  Constitutional: Negative.   Respiratory: Negative.   Cardiovascular: Negative.   Gastrointestinal: Positive for nausea, abdominal pain and diarrhea. Negative for blood in stool.  Musculoskeletal: Negative.   Skin: Negative.   Neurological: Positive for headaches.  Psychiatric/Behavioral: Negative.   All other systems reviewed and are negative.    Allergies  Morphine and related  Home Medications  No current outpatient prescriptions on file.  BP 123/63  Pulse 79  Temp(Src) 98.3 F (36.8 C) (Oral)  Resp 15  SpO2 96%  Physical Exam  Nursing note and vitals reviewed. Constitutional: He appears well-developed and well-nourished.  HENT:  Head: Normocephalic and atraumatic.  Eyes: Conjunctivae are normal. Pupils are equal, round, and reactive to light.  Neck: Neck supple. No tracheal deviation present. No thyromegaly  present.  Cardiovascular: Normal rate and regular rhythm.   No murmur heard. Pulmonary/Chest: Effort normal and breath sounds normal.  Abdominal: Soft. Bowel sounds are normal. He exhibits no distension and no mass. There is tenderness. There is no rebound and no guarding.  Minimally tender at right lower quadrant  Genitourinary: Penis normal.  Normal male genitalia, UNCIRCUMCIZED  Musculoskeletal: Normal range of motion. He exhibits no edema and no tenderness.  Neurological: He is alert. Coordination normal.  Skin: Skin is warm and dry. No rash noted.  Psychiatric: He has a normal mood and affect.    ED Course  Procedures (including critical care time) Declined pain medicine Labs Reviewed  URINALYSIS, ROUTINE W REFLEX MICROSCOPIC  CBC WITH DIFFERENTIAL   Results for orders placed during the hospital encounter of 07/29/12  URINALYSIS, ROUTINE W REFLEX MICROSCOPIC      Result Value Range   Color, Urine YELLOW  YELLOW   APPearance CLEAR  CLEAR   Specific Gravity, Urine 1.028  1.005 - 1.030   pH 6.0  5.0 - 8.0   Glucose, UA NEGATIVE  NEGATIVE mg/dL   Hgb urine dipstick NEGATIVE  NEGATIVE   Bilirubin Urine NEGATIVE  NEGATIVE   Ketones, ur NEGATIVE  NEGATIVE mg/dL   Protein, ur NEGATIVE  NEGATIVE mg/dL   Urobilinogen, UA 1.0  0.0 - 1.0 mg/dL   Nitrite NEGATIVE  NEGATIVE   Leukocytes, UA NEGATIVE  NEGATIVE  CBC WITH DIFFERENTIAL      Result Value Range   WBC 5.3  4.0 - 10.5 K/uL   RBC 5.02  4.22 - 5.81 MIL/uL   Hemoglobin 15.0  13.0 - 17.0 g/dL  HCT 43.5  39.0 - 52.0 %   MCV 86.7  78.0 - 100.0 fL   MCH 29.9  26.0 - 34.0 pg   MCHC 34.5  30.0 - 36.0 g/dL   RDW 78.2  95.6 - 21.3 %   Platelets 150  150 - 400 K/uL   Neutrophils Relative 81 (*) 43 - 77 %   Neutro Abs 4.3  1.7 - 7.7 K/uL   Lymphocytes Relative 10 (*) 12 - 46 %   Lymphs Abs 0.5 (*) 0.7 - 4.0 K/uL   Monocytes Relative 8  3 - 12 %   Monocytes Absolute 0.4  0.1 - 1.0 K/uL   Eosinophils Relative 1  0 - 5 %    Eosinophils Absolute 0.1  0.0 - 0.7 K/uL   Basophils Relative 0  0 - 1 %   Basophils Absolute 0.0  0.0 - 0.1 K/uL  POCT I-STAT, CHEM 8      Result Value Range   Sodium 140  135 - 145 mEq/L   Potassium 3.8  3.5 - 5.1 mEq/L   Chloride 106  96 - 112 mEq/L   BUN 20  6 - 23 mg/dL   Creatinine, Ser 0.86  0.50 - 1.35 mg/dL   Glucose, Bld 85  70 - 99 mg/dL   Calcium, Ion 5.78  4.69 - 1.23 mmol/L   TCO2 28  0 - 100 mmol/L   Hemoglobin 15.6  13.0 - 17.0 g/dL   HCT 62.9  52.8 - 41.3 %   No results found.  No results found.  Date: 03 PM developed abdominal pain after drinking water. bENTYL ordered No diagnosis found. 5:30 PM feels much improved after treatment with Bentyl. Feels hungry  MDM  Doubt appendicitis with intermittent nature of pain, copious diarrhea and patient is presently hungry No fever no leukocytosis Plan avoid milk products. Imodium. Return if worse 24 hours Dx #1 nonspecific abdominal pain #2 diarrhea       Doug Sou, MD 07/29/12 [phone]

## 2012-08-09 ENCOUNTER — Emergency Department (HOSPITAL_COMMUNITY): Payer: Self-pay

## 2012-08-09 ENCOUNTER — Emergency Department (HOSPITAL_COMMUNITY)
Admission: EM | Admit: 2012-08-09 | Discharge: 2012-08-09 | Disposition: A | Discharge status: Home / Self Care | Payer: Self-pay | Attending: Emergency Medicine | Admitting: Emergency Medicine

## 2012-08-09 ENCOUNTER — Encounter (HOSPITAL_COMMUNITY): Payer: Self-pay | Admitting: Emergency Medicine

## 2012-08-09 DIAGNOSIS — F17200 Nicotine dependence, unspecified, uncomplicated: Secondary | ICD-10-CM | POA: Insufficient documentation

## 2012-08-09 DIAGNOSIS — IMO0002 Reserved for concepts with insufficient information to code with codable children: Secondary | ICD-10-CM | POA: Insufficient documentation

## 2012-08-09 DIAGNOSIS — M549 Dorsalgia, unspecified: Secondary | ICD-10-CM

## 2012-08-09 DIAGNOSIS — S0003XA Contusion of scalp, initial encounter: Secondary | ICD-10-CM | POA: Insufficient documentation

## 2012-08-09 DIAGNOSIS — S0083XA Contusion of other part of head, initial encounter: Secondary | ICD-10-CM

## 2012-08-09 MED ORDER — ACETAMINOPHEN 325 MG PO TABS
650.0000 mg | ORAL_TABLET | Freq: Once | ORAL | Status: AC
  Administered 2012-08-09: 650 mg via ORAL
  Filled 2012-08-09: qty 2
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
## 2012-08-09 NOTE — ED Notes (Signed)
Patient to radiology at this time.
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
## 2012-10-03 ENCOUNTER — Encounter (HOSPITAL_COMMUNITY): Payer: Self-pay | Admitting: Physical Medicine and Rehabilitation

## 2012-10-03 ENCOUNTER — Emergency Department (HOSPITAL_COMMUNITY)
Admission: EM | Admit: 2012-10-03 | Discharge: 2012-10-03 | Disposition: A | Discharge status: Home / Self Care | Payer: Self-pay | Attending: Emergency Medicine | Admitting: Emergency Medicine

## 2012-10-03 DIAGNOSIS — Z0000 Encounter for general adult medical examination without abnormal findings: Secondary | ICD-10-CM

## 2012-10-03 DIAGNOSIS — Z711 Person with feared health complaint in whom no diagnosis is made: Secondary | ICD-10-CM | POA: Insufficient documentation

## 2012-10-03 DIAGNOSIS — F17200 Nicotine dependence, unspecified, uncomplicated: Secondary | ICD-10-CM | POA: Insufficient documentation

## 2012-10-03 NOTE — ED Notes (Signed)
Pt requesting TB skin test. No other complaints.

## 2012-10-03 NOTE — ED Provider Notes (Signed)
History     CSN: [id number]  Arrival date & time 10/03/12  1149   First MD Initiated Contact with Patient 10/03/12 1223      Chief Complaint  Patient presents with  . PPD Reading    (Consider location/radiation/quality/duration/timing/severity/associated sxs/prior treatment) HPI Shane Blanchard is a 22 y.o. male who presents to ED with complaint of needed a PPD test for his job. States no symptoms, no cough, no night sweats, no fever, no exposure. Had recent test done in jail, but states "i dont want to take my jail form to my new job." Denies any other complaints.    No past medical history on file.  No past surgical history on file.  History reviewed. No pertinent family history.  History  Substance Use Topics  . Smoking status: Current Every Day Smoker -- 0.50 packs/day    Types: Cigarettes  . Smokeless tobacco: Not on file  . Alcohol Use: No      Review of Systems  All other systems reviewed and are negative.    Allergies  Morphine and related  Home Medications  No current outpatient prescriptions on file.  BP 140/62  Pulse 68  Resp 16  SpO2 97%  Physical Exam  Nursing note and vitals reviewed. Constitutional: He appears well-developed and well-nourished. No distress.  Eyes: Conjunctivae are normal.  Neck: Neck supple.  Cardiovascular: Normal rate, regular rhythm and normal heart sounds.   Pulmonary/Chest: Effort normal and breath sounds normal. No respiratory distress. He has no wheezes. He has no rales.    ED Course  Procedures (including critical care time)  Labs Reviewed - No data to display No results found.   1. Normal physical exam       MDM  Pt needing PPD for his job. He is asymptomatic. Afebrile. No complaints. Do not do PPD here in ED. Will send to occupational health or urgent care.   Filed Vitals:   10/03/12 1157  BP: 140/62  Pulse: 68  Resp: 16  SpO2: 97%           Ipek Westra A Kyanna Mahrt, PA-C 10/03/12 1305

## 2012-10-03 NOTE — ED Notes (Signed)
Unable to obtain temp, patient drinking fluid

## 2012-10-05 NOTE — ED Provider Notes (Signed)
Medical screening examination/treatment/procedure(s) were performed by non-physician practitioner and as supervising physician I was immediately available for consultation/collaboration.   Percival Glasheen H Jaydien Panepinto, MD 10/05/12 1527 

## 2012-12-05 ENCOUNTER — Emergency Department (HOSPITAL_COMMUNITY): Payer: No Typology Code available for payment source

## 2012-12-05 ENCOUNTER — Encounter (HOSPITAL_COMMUNITY): Payer: Self-pay | Admitting: Emergency Medicine

## 2012-12-05 ENCOUNTER — Emergency Department (HOSPITAL_COMMUNITY)
Admission: EM | Admit: 2012-12-05 | Discharge: 2012-12-05 | Disposition: A | Discharge status: Home / Self Care | Payer: No Typology Code available for payment source | Attending: Emergency Medicine | Admitting: Emergency Medicine

## 2012-12-05 DIAGNOSIS — S4991XA Unspecified injury of right shoulder and upper arm, initial encounter: Secondary | ICD-10-CM

## 2012-12-05 DIAGNOSIS — S46909A Unspecified injury of unspecified muscle, fascia and tendon at shoulder and upper arm level, unspecified arm, initial encounter: Secondary | ICD-10-CM | POA: Insufficient documentation

## 2012-12-05 DIAGNOSIS — IMO0002 Reserved for concepts with insufficient information to code with codable children: Secondary | ICD-10-CM | POA: Insufficient documentation

## 2012-12-05 DIAGNOSIS — Y9389 Activity, other specified: Secondary | ICD-10-CM | POA: Insufficient documentation

## 2012-12-05 DIAGNOSIS — F17200 Nicotine dependence, unspecified, uncomplicated: Secondary | ICD-10-CM | POA: Insufficient documentation

## 2012-12-05 DIAGNOSIS — S4980XA Other specified injuries of shoulder and upper arm, unspecified arm, initial encounter: Secondary | ICD-10-CM | POA: Insufficient documentation

## 2012-12-05 DIAGNOSIS — S0993XA Unspecified injury of face, initial encounter: Secondary | ICD-10-CM | POA: Insufficient documentation

## 2012-12-05 DIAGNOSIS — M542 Cervicalgia: Secondary | ICD-10-CM

## 2012-12-05 DIAGNOSIS — S199XXA Unspecified injury of neck, initial encounter: Secondary | ICD-10-CM | POA: Insufficient documentation

## 2012-12-05 MED ORDER — NAPROXEN 500 MG PO TABS: 500.0000 mg | ORAL_TABLET | Freq: Two times a day (BID) | ORAL | Status: DC

## 2012-12-05 MED ORDER — CYCLOBENZAPRINE HCL 10 MG PO TABS: 10.0000 mg | ORAL_TABLET | Freq: Two times a day (BID) | ORAL | Status: DC | PRN

## 2012-12-05 MED ORDER — DIAZEPAM 5 MG PO TABS
5.0000 mg | ORAL_TABLET | Freq: Once | ORAL | Status: AC
  Administered 2012-12-05: 5 mg via ORAL
  Filled 2012-12-05: qty 1

## 2012-12-05 MED ORDER — KETOROLAC TROMETHAMINE 60 MG/2ML IM SOLN
60.0000 mg | Freq: Once | INTRAMUSCULAR | Status: DC
  Filled 2012-12-05: qty 2
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
## 2012-12-06 NOTE — ED Provider Notes (Signed)
Medical screening examination/treatment/procedure(s) were performed by non-physician practitioner and as supervising physician I was immediately available for consultation/collaboration.   Suzi Roots, MD 12/06/12 [phone]

## 2013-04-20 ENCOUNTER — Ambulatory Visit: Payer: Self-pay | Attending: Internal Medicine | Admitting: Internal Medicine

## 2013-04-20 DIAGNOSIS — M549 Dorsalgia, unspecified: Secondary | ICD-10-CM | POA: Insufficient documentation

## 2013-04-20 LAB — COMPLETE METABOLIC PANEL WITH GFR
BUN: 15 mg/dL (ref 6–23)
CO2: 27 mEq/L (ref 19–32)
GFR, Est African American: >89 mL/min
GFR, Est Non African American: >89 mL/min
Glucose, Bld: 91 mg/dL (ref 70–99)
Sodium: 141 mEq/L (ref 135–145)
Total Bilirubin: 0.5 mg/dL (ref 0.3–1.2)
Total Protein: 7.5 g/dL (ref 6.0–8.3)

## 2013-04-20 LAB — LIPID PANEL
Cholesterol: 159 mg/dL (ref 0–200)
HDL: 39 mg/dL — ABNORMAL LOW (ref >39)
Triglycerides: 118 mg/dL (ref <150)

## 2013-04-20 LAB — CBC WITH DIFFERENTIAL/PLATELET
Basophils Absolute: 0 10*3/uL (ref 0.0–0.1)
Lymphocytes Relative: 24 % (ref 12–46)
Lymphs Abs: 0.9 10*3/uL (ref 0.7–4.0)
Neutro Abs: 2.5 10*3/uL (ref 1.7–7.7)
Neutrophils Relative %: 63 % (ref 43–77)
Platelets: 173 10*3/uL (ref 150–400)
RBC: 5.01 MIL/uL (ref 4.22–5.81)
RDW: 13 % (ref 11.5–15.5)
WBC: 3.9 10*3/uL — ABNORMAL LOW (ref 4.0–10.5)

## 2013-04-20 LAB — TSH: TSH: 0.732 u[IU]/mL (ref 0.350–4.500)

## 2013-04-20 MED ORDER — CYCLOBENZAPRINE HCL 10 MG PO TABS: 10.0000 mg | ORAL_TABLET | Freq: Two times a day (BID) | ORAL | Status: DC | PRN

## 2013-04-20 MED ORDER — PREDNISONE 20 MG PO TABS: 40.0000 mg | ORAL_TABLET | Freq: Every day | ORAL | Status: DC
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
## 2013-04-22 ENCOUNTER — Telehealth: Payer: Self-pay | Admitting: *Deleted

## 2013-04-22 NOTE — Telephone Encounter (Signed)
Contacted patient to notify him that his labs are within normal limits, MRI results are still pending. Pt acknowledged information with no problems.

## 2013-04-24 ENCOUNTER — Emergency Department (HOSPITAL_COMMUNITY)
Admission: EM | Admit: 2013-04-24 | Discharge: 2013-04-24 | Disposition: A | Discharge status: Home / Self Care | Payer: No Typology Code available for payment source | Attending: Emergency Medicine | Admitting: Emergency Medicine

## 2013-04-24 ENCOUNTER — Encounter (HOSPITAL_COMMUNITY): Payer: Self-pay | Admitting: Emergency Medicine

## 2013-04-24 ENCOUNTER — Emergency Department (HOSPITAL_COMMUNITY)
Admission: EM | Admit: 2013-04-24 | Discharge: 2013-04-25 | Disposition: A | Discharge status: Home / Self Care | Payer: No Typology Code available for payment source | Attending: Emergency Medicine | Admitting: Emergency Medicine

## 2013-04-24 DIAGNOSIS — J029 Acute pharyngitis, unspecified: Secondary | ICD-10-CM | POA: Insufficient documentation

## 2013-04-24 DIAGNOSIS — B349 Viral infection, unspecified: Secondary | ICD-10-CM

## 2013-04-24 DIAGNOSIS — F17200 Nicotine dependence, unspecified, uncomplicated: Secondary | ICD-10-CM | POA: Insufficient documentation

## 2013-04-24 DIAGNOSIS — Z79899 Other long term (current) drug therapy: Secondary | ICD-10-CM | POA: Insufficient documentation

## 2013-04-24 DIAGNOSIS — Z791 Long term (current) use of non-steroidal anti-inflammatories (NSAID): Secondary | ICD-10-CM | POA: Insufficient documentation

## 2013-04-24 DIAGNOSIS — Z87828 Personal history of other (healed) physical injury and trauma: Secondary | ICD-10-CM | POA: Insufficient documentation

## 2013-04-24 DIAGNOSIS — R509 Fever, unspecified: Secondary | ICD-10-CM | POA: Insufficient documentation

## 2013-04-24 DIAGNOSIS — J069 Acute upper respiratory infection, unspecified: Secondary | ICD-10-CM

## 2013-04-24 DIAGNOSIS — IMO0002 Reserved for concepts with insufficient information to code with codable children: Secondary | ICD-10-CM | POA: Insufficient documentation

## 2013-04-24 DIAGNOSIS — IMO0001 Reserved for inherently not codable concepts without codable children: Secondary | ICD-10-CM | POA: Insufficient documentation

## 2013-04-24 DIAGNOSIS — R51 Headache: Secondary | ICD-10-CM | POA: Insufficient documentation

## 2013-04-24 DIAGNOSIS — R000 Tachycardia, unspecified: Secondary | ICD-10-CM | POA: Insufficient documentation

## 2013-04-24 DIAGNOSIS — B9789 Other viral agents as the cause of diseases classified elsewhere: Secondary | ICD-10-CM | POA: Insufficient documentation

## 2013-04-24 MED ORDER — IBUPROFEN 800 MG PO TABS: 800.0000 mg | ORAL_TABLET | Freq: Three times a day (TID) | ORAL | Status: AC

## 2013-04-24 MED ORDER — IBUPROFEN 400 MG PO TABS
800.0000 mg | ORAL_TABLET | Freq: Once | ORAL | Status: AC
  Administered 2013-04-25: 800 mg via ORAL
  Filled 2013-04-24: qty 2

## 2013-04-24 MED ORDER — IBUPROFEN 400 MG PO TABS
600.0000 mg | ORAL_TABLET | Freq: Once | ORAL | Status: AC
  Administered 2013-04-24: 600 mg via ORAL
  Filled 2013-04-24 (×2): qty 1

## 2013-04-24 MED ORDER — BENZONATATE 100 MG PO CAPS: 100.0000 mg | ORAL_CAPSULE | Freq: Three times a day (TID) | ORAL | Status: AC
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
## 2013-04-24 NOTE — ED Notes (Signed)
Pt c/o fever and body aches with URI sx; pt seen her this am for same

## 2013-04-24 NOTE — ED Notes (Signed)
Pt c/o fever

## 2013-04-24 NOTE — ED Notes (Signed)
Pt reports dry nonproductive cough, fever and sore throat which began yesterday. Pt current smoker. VSS. NAD at present.
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
## 2013-04-24 NOTE — ED Notes (Addendum)
States this is third visit today for same symptoms
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
## 2013-04-25 NOTE — ED Notes (Signed)
PT seen this am and tonight for cold symptoms, returns due to elevated temp

## 2013-04-25 NOTE — ED Provider Notes (Signed)
Medical screening examination/treatment/procedure(s) were performed by non-physician practitioner and as supervising physician I was immediately available for consultation/collaboration.  EKG Interpretation   None        Derwood Kaplan, MD 04/25/13 2300

## 2013-04-26 LAB — CULTURE, GROUP A STREP

## 2013-04-27 NOTE — ED Provider Notes (Signed)
Medical screening examination/treatment/procedure(s) were performed by non-physician practitioner and as supervising physician I was immediately available for consultation/collaboration.  Toy Baker, MD 04/27/13 [phone]

## 2013-06-04 IMAGING — CR DG CHEST 2V
2 series · 2 of 2 positions shown · non-contrast
Comparison: None.

CLINICAL DATA: Cough and chest pain.  Cigarette smoker.

CHEST - 2 VIEW

[w chest pa]
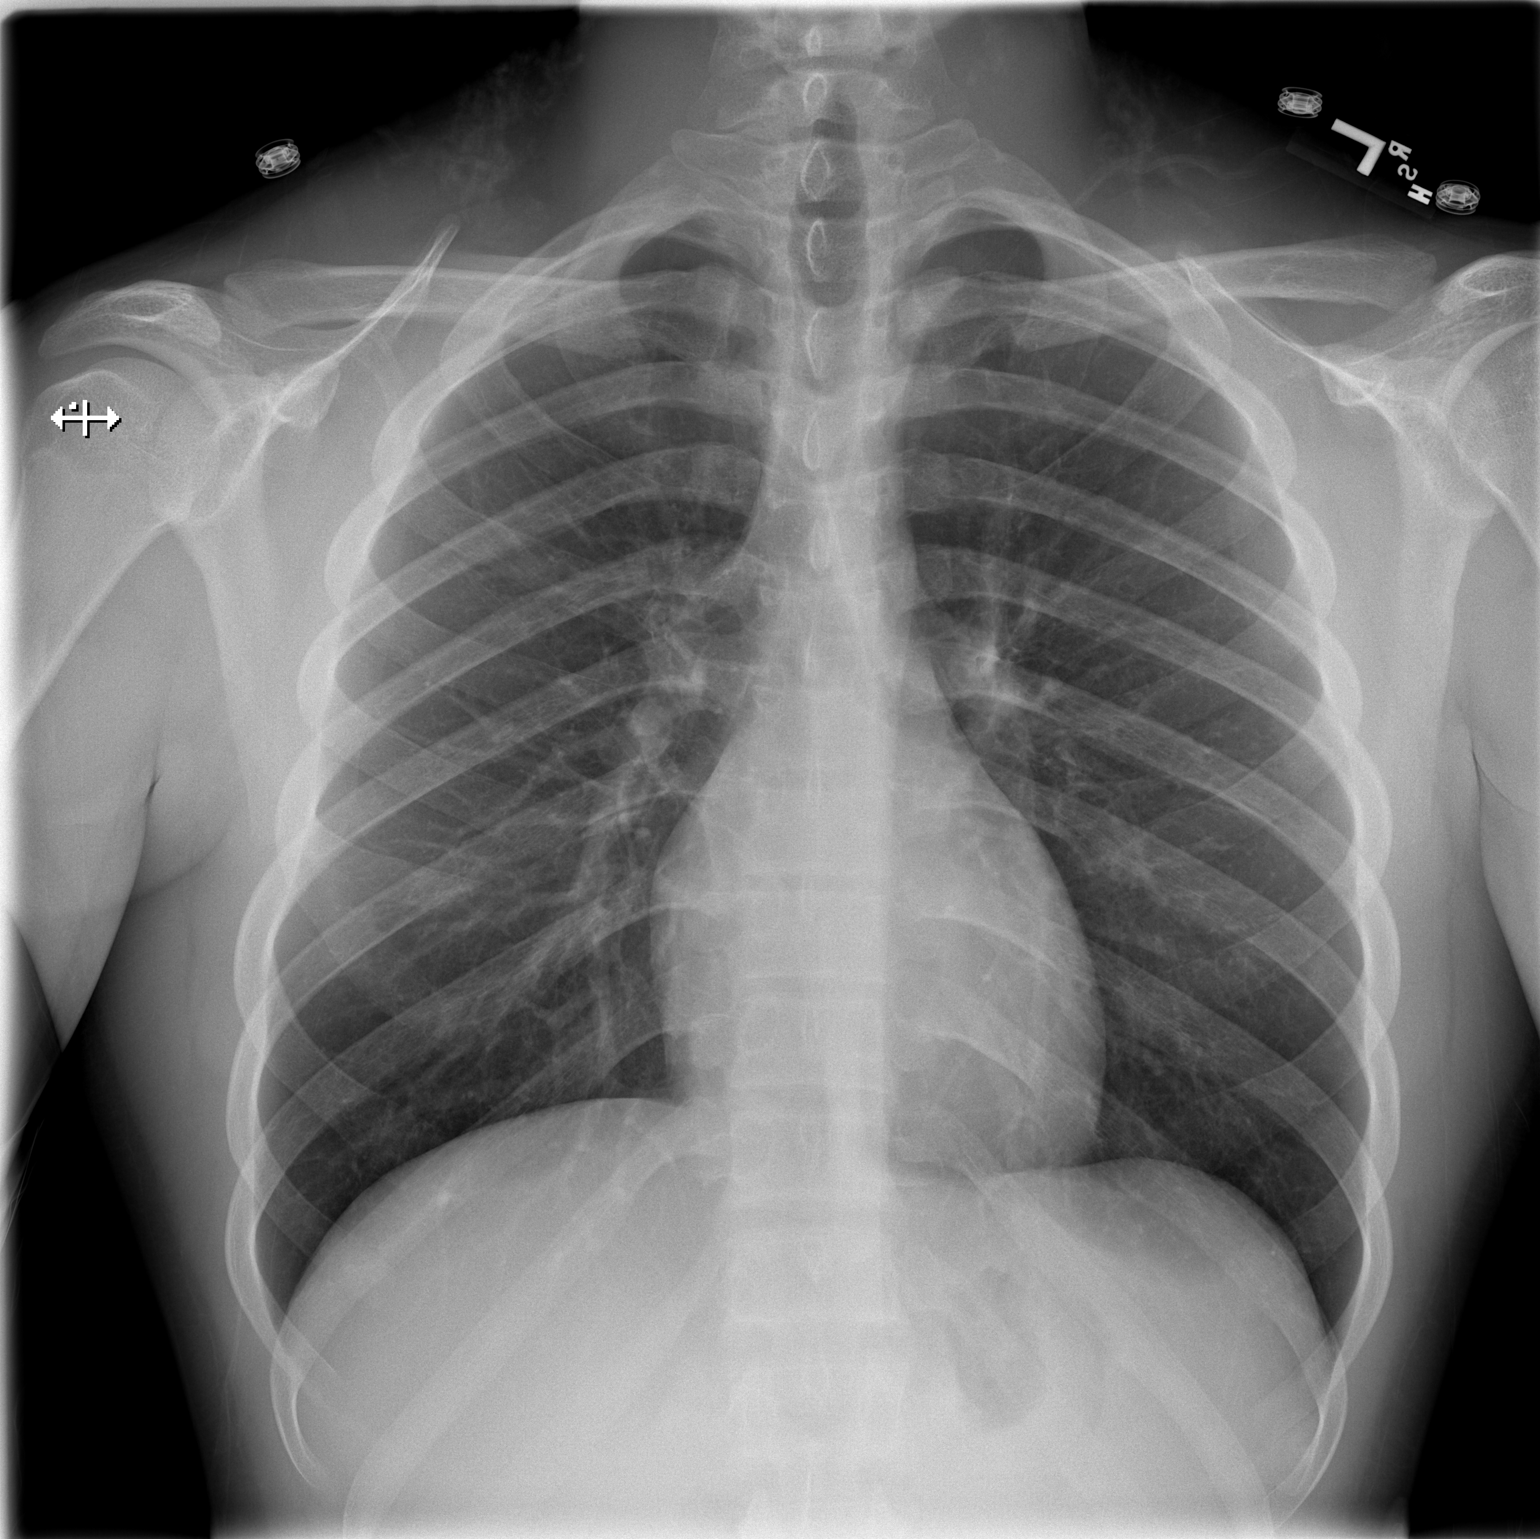

[w chest lat]
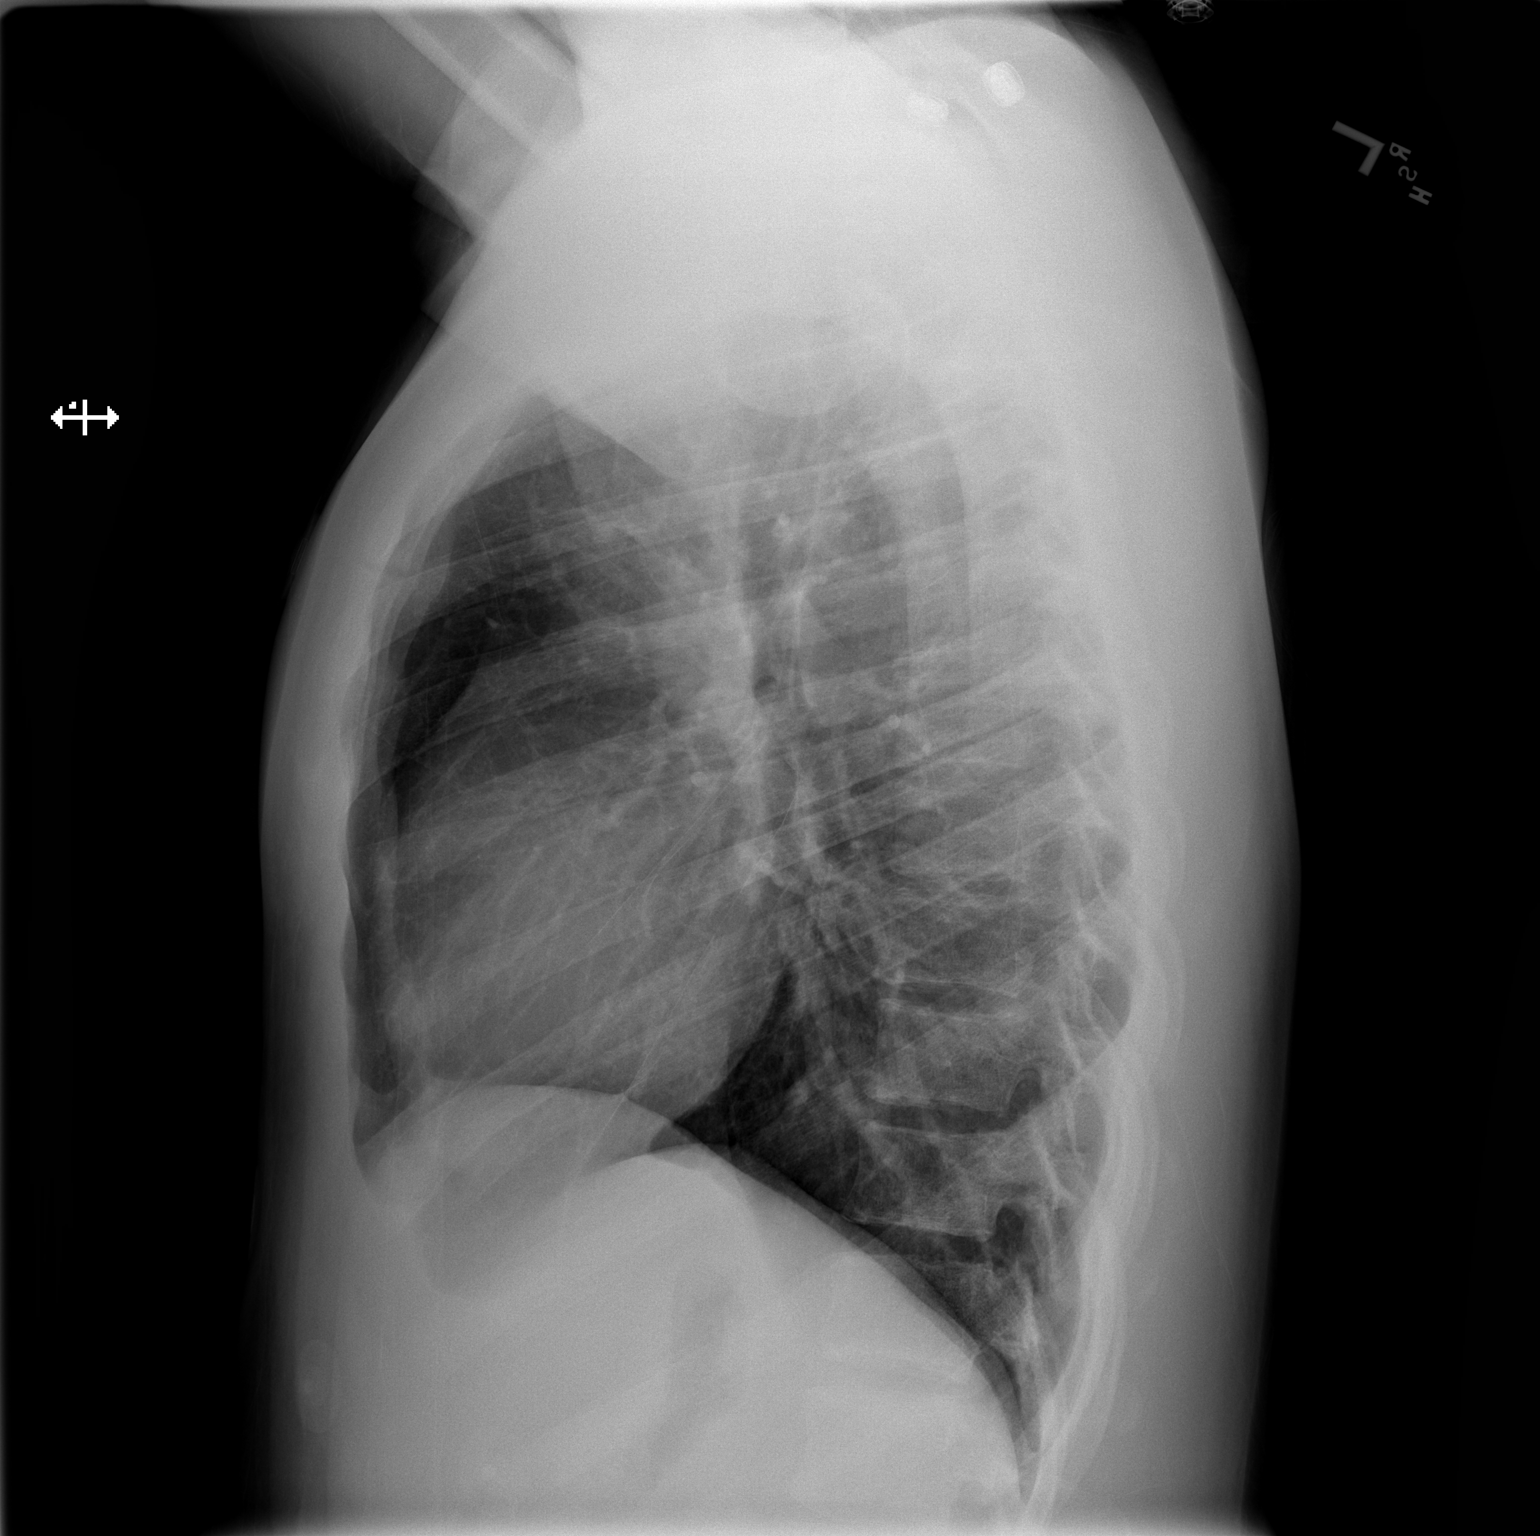

[2 of 2 positions shown; findings below may reference images not displayed]

FINDINGS: Cardiopericardial silhouette within normal limits.
Mediastinal contours normal. Trachea midline.  No airspace disease
or effusion.
IMPRESSION: No active cardiopulmonary disease.

## 2013-07-19 ENCOUNTER — Ambulatory Visit: Payer: Self-pay

## 2013-07-22 ENCOUNTER — Ambulatory Visit: Payer: Self-pay | Admitting: Internal Medicine
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
## 2021-12-12 ENCOUNTER — Encounter (HOSPITAL_BASED_OUTPATIENT_CLINIC_OR_DEPARTMENT_OTHER): Payer: Self-pay | Admitting: Emergency Medicine

## 2021-12-12 ENCOUNTER — Other Ambulatory Visit: Payer: Self-pay

## 2021-12-12 ENCOUNTER — Emergency Department (HOSPITAL_BASED_OUTPATIENT_CLINIC_OR_DEPARTMENT_OTHER)
Admission: EM | Admit: 2021-12-12 | Discharge: 2021-12-12 | Disposition: A | Discharge status: Home / Self Care | Payer: Self-pay | Attending: Emergency Medicine | Admitting: Emergency Medicine

## 2021-12-12 ENCOUNTER — Emergency Department (HOSPITAL_BASED_OUTPATIENT_CLINIC_OR_DEPARTMENT_OTHER): Payer: Self-pay

## 2021-12-12 DIAGNOSIS — R1013 Epigastric pain: Secondary | ICD-10-CM | POA: Insufficient documentation

## 2021-12-12 DIAGNOSIS — R109 Unspecified abdominal pain: Secondary | ICD-10-CM

## 2021-12-12 LAB — CBC
HCT: 43.4 % (ref 39.0–52.0)
Hemoglobin: 14.6 g/dL (ref 13.0–17.0)
MCH: 29.7 pg (ref 26.0–34.0)
MCHC: 33.6 g/dL (ref 30.0–36.0)
MCV: 88.2 fL (ref 80.0–100.0)
Platelets: 156 10*3/uL (ref 150–400)
RBC: 4.92 MIL/uL (ref 4.22–5.81)
RDW: 12.2 % (ref 11.5–15.5)
WBC: 5 10*3/uL (ref 4.0–10.5)
nRBC: 0 % (ref 0.0–0.2)

## 2021-12-12 LAB — COMPREHENSIVE METABOLIC PANEL
ALT: 63 U/L — ABNORMAL HIGH (ref 0–44)
AST: 74 U/L — ABNORMAL HIGH (ref 15–41)
Albumin: 4.9 g/dL (ref 3.5–5.0)
Alkaline Phosphatase: 51 U/L (ref 38–126)
Anion gap: 14 (ref 5–15)
BUN: 12 mg/dL (ref 6–20)
CO2: 21 mmol/L — ABNORMAL LOW (ref 22–32)
Calcium: 9.9 mg/dL (ref 8.9–10.3)
Chloride: 103 mmol/L (ref 98–111)
Creatinine, Ser: 0.87 mg/dL (ref 0.61–1.24)
GFR, Estimated: >60 mL/min (ref >60)
Glucose, Bld: 89 mg/dL (ref 70–99)
Potassium: 3.5 mmol/L (ref 3.5–5.1)
Sodium: 138 mmol/L (ref 135–145)
Total Bilirubin: 1 mg/dL (ref 0.3–1.2)
Total Protein: 8.3 g/dL — ABNORMAL HIGH (ref 6.5–8.1)

## 2021-12-12 LAB — LIPASE, BLOOD: Lipase: <10 U/L — ABNORMAL LOW (ref 11–51)

## 2021-12-12 MED ORDER — ONDANSETRON HCL 4 MG/2ML IJ SOLN: 4.0000 mg | Freq: Once | INTRAMUSCULAR | Status: DC

## 2021-12-12 MED ORDER — SODIUM CHLORIDE 0.9 % IV BOLUS: 1000.0000 mL | Freq: Once | INTRAVENOUS | Status: DC

## 2021-12-12 MED ORDER — HYDROMORPHONE HCL 1 MG/ML IJ SOLN: 0.5000 mg | Freq: Once | INTRAMUSCULAR | Status: DC

## 2021-12-12 NOTE — Discharge Instructions (Signed)
Diagnosed with abdominal pain.  Ultrasound of liver and gallbladder were normal.  Also pertinent abdominal labs were unremarkable.  Recommend follow-up with PCP in the next few days.  Otherwise, recommend Tylenol and Advil for pain as needed.  If abdominal pain worsens, new fever, bloody stools, blood in your vomit , or bloody urine please return to the emergency department.

## 2021-12-12 NOTE — ED Provider Notes (Signed)
[facility] EMERGENCY DEPT Provider Note   CSN: [id number] Arrival date & time: 12/12/21  1546     History  Chief Complaint  Patient presents with   Abdominal Pain    Shane Blanchard is a 31 y.o. male presenting with abdominal pain.  Pain is epigastric and radiates to his back.  It is constant and sharp in nature.  Patient reports that he had at least 7 shots of of liquor last night may be more.  This morning he woke up with abdominal pain also vomited once prompting evaluation here in the emergency department.  Patient also reports that he smokes marijuana occasionally.  Patient denies shortness of breath chest pain.  Denies fever and sick contacts.  Denies hematuria or changes in urinary frequency.  Also denies bloody stools and hematemesis.   Abdominal Pain      Home Medications Prior to Admission medications   Medication Sig Start Date End Date Taking? Authorizing Provider  benzonatate (TESSALON) 100 MG capsule Take 1 capsule (100 mg total) by mouth every 8 (eight) hours. 04/24/13   Gilda Crease, MD  ibuprofen (ADVIL,MOTRIN) 800 MG tablet Take 1 tablet (800 mg total) by mouth 3 (three) times daily. 04/24/13   Gilda Crease, MD  Pseudoeph-Doxylamine-DM-APAP (NYQUIL PO) Take 1 capsule by mouth 2 (two) times daily as needed (for cough and cold symptoms).    [provider]      Allergies    Morphine and related    Review of Systems   Review of Systems  Gastrointestinal:  Positive for abdominal pain.    Physical Exam Updated Vital Signs BP (!) 154/77   Pulse 65   Temp 98.3 F (36.8 C)   Resp 15   SpO2 100%  Physical Exam Vitals and nursing note reviewed.  HENT:     Head: Normocephalic and atraumatic.     Mouth/Throat:     Mouth: Mucous membranes are moist.  Eyes:     General:        Right eye: No discharge.        Left eye: No discharge.     Conjunctiva/sclera: Conjunctivae normal.  Cardiovascular:     Rate and  Rhythm: Normal rate and regular rhythm.     Pulses: Normal pulses.     Heart sounds: Normal heart sounds.  Pulmonary:     Effort: Pulmonary effort is normal.     Breath sounds: Normal breath sounds.  Abdominal:     General: Abdomen is flat.     Palpations: Abdomen is soft.  Skin:    General: Skin is warm and dry.  Neurological:     General: No focal deficit present.  Psychiatric:        Mood and Affect: Mood normal.     ED Results / Procedures / Treatments   Labs (all labs ordered are listed, but only abnormal results are displayed) Labs Reviewed  LIPASE, BLOOD - Abnormal; Notable for the following components:      Result Value   Lipase <10 (*)    All other components within normal limits  COMPREHENSIVE METABOLIC PANEL - Abnormal; Notable for the following components:   CO2 21 (*)    Total Protein 8.3 (*)    AST 74 (*)    ALT 63 (*)    All other components within normal limits  CBC    EKG None  Radiology US Abdomen Limited RUQ (LIVER/GB)  Result Date: 12/12/2021 CLINICAL DATA:  Generalized abdominal pain EXAM:  ULTRASOUND ABDOMEN LIMITED RIGHT UPPER QUADRANT COMPARISON:  None Available. FINDINGS: Gallbladder: No gallstones or wall thickening visualized. No sonographic Murphy sign noted by sonographer. Common bile duct: Diameter: Normal caliber, 2 mm Liver: Increased echotexture compatible with fatty infiltration. No focal abnormality or biliary ductal dilatation. portal vein is patent on color Doppler imaging with normal direction of blood flow towards the liver. Other: None. IMPRESSION: Fatty liver. No acute findings. Electronically Signed   By: Charlett Nose M.D.   On: 12/12/2021 18:44    Procedures Procedures    Medications Ordered in ED Medications  HYDROmorphone (DILAUDID) injection 0.5 mg (has no administration in time range)  ondansetron (ZOFRAN) injection 4 mg (has no administration in time range)  sodium chloride 0.9 % bolus 1,000 mL (has no administration in  time range)    ED Course/ Medical Decision Making/ A&P                           Medical Decision Making Amount and/or Complexity of Data Reviewed Labs: ordered. Radiology: ordered.  Risk Prescription drug management.   This patient presents to the ED for concern of abdominal pain, this involves a number of treatment options, and is a complaint that carries with it a high risk of complications and morbidity.  The differential diagnosis includes acute pancreatis, hepatitis, and/or gall bladder pathology.  Overall patient appears clinically well.  Could not elicit pain on palpation during exam.  Abdominal exam overall unremarkable.  Abdominal labs also unremarkable.  Given reassuring physical exam and unremarkable abdominal labs, thought it appropriate to forego CT scan.  Ultrasound of gallbladder and liver both negative.  Treated pain with Dilaudid and Zofran for nausea.  Also ordered 1 L bolus for volume resuscitation.      EMR reviewed including pt PMHx, past surgical history and past visits to ER.   See HPI for more details   Lab Tests:   I personally reviewed all laboratory work and imaging. Metabolic panel without any acute abnormality specifically kidney function within normal limits and no significant electrolyte abnormalities. CBC without leukocytosis or significant anemia.   Imaging Studies:  NAD. I personally reviewed all imaging studies and no acute abnormality found. I agree with radiology interpretation.    Cardiac Monitoring:  The patient was maintained on a cardiac monitor.  I personally viewed and interpreted the cardiac monitored which showed an underlying rhythm of: NSR NA   Medicines ordered:  I ordered medication including Dilaudid for pain, Zofran for nausea, and 1 L LR bolus for volume resuscitation. Reevaluation of the patient after these medicines showed that the patient improved I have reviewed the patients home medicines and have made  adjustments as needed  Reevaluation:  After the interventions noted above I re-evaluated patient and found that they have :improved     Problem List / ED Course:  Patient presented with abdominal pain.  Physical exam was overall unremarkable.  Could not elicit pain in the abdomen.  Labs also reassurance with no elevation and lipase.  Ultrasound gallbladder and liver normal.  Vitals normal.  Treated pain and gave 1 L bolus for volume her resuscitation.  Recommended close follow-up with PCP.   Dispostion:  After consideration of the diagnostic results and the patients response to treatment, I feel that the patent would benefit from follow up with PCP.        Final Clinical Impression(s) / ED Diagnoses Final diagnoses:  Abdominal pain, unspecified abdominal location  Rx / DC Orders ED Discharge Orders     None         Gareth Eagle, PA-C 12/12/21 1913    Edwin Dada P, DO 12/13/21 [phone]

## 2021-12-12 NOTE — ED Notes (Signed)
[facility], coming from home, out partying last night, sharp abd pain today. Down to legs . 150/80 ,96 hr, 98 % ra.

## 2021-12-12 NOTE — ED Notes (Signed)
Pt refused further and requested to be discharged. PA Jonny Ruiz was notified. Provider spoke with Pt and discharged papers were printed.

## 2021-12-12 NOTE — ED Triage Notes (Signed)
Pt has sharp abdominal pain, he also states he has had possible infection in tooth right upper mouth.
